# Patient Record
Sex: Male | Born: 2000 | Race: Black or African American | Hispanic: No | Marital: Single
Health system: Southern US, Community
[De-identification: ages and names within clinical notes are randomized; demographics above are authoritative.]

---
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
## 2020-02-12 ENCOUNTER — Emergency Department (HOSPITAL_COMMUNITY)
Admission: EM | Admit: 2020-02-12 | Discharge: 2020-02-12 | Disposition: A | Discharge status: Home / Self Care | Payer: 59 | Attending: Emergency Medicine | Admitting: Emergency Medicine

## 2020-02-12 ENCOUNTER — Encounter (HOSPITAL_COMMUNITY): Payer: Self-pay | Admitting: Emergency Medicine

## 2020-02-12 ENCOUNTER — Emergency Department (HOSPITAL_COMMUNITY): Payer: 59

## 2020-02-12 DIAGNOSIS — N50812 Left testicular pain: Secondary | ICD-10-CM | POA: Diagnosis present

## 2020-02-12 NOTE — ED Provider Notes (Signed)
[facility] EMERGENCY DEPARTMENT Provider Note   CSN: [id number] Arrival date & time: 02/12/20  0809     History Chief Complaint  Patient presents with  . Testicle Pain    Gerald Barr is a 19 y.o. male.  Patient presents with persistent left testicle pain for 1 month. No injuries, no concerns for STDs, no history of STDs, no dysuria or discharge. No weight loss fevers or chills.        History reviewed. No pertinent past medical history.  There are no problems to display for this patient.   History reviewed. No pertinent surgical history.     No family history on file.  Social History   Tobacco Use  . Smoking status: Not on file  Substance Use Topics  . Alcohol use: Not on file  . Drug use: Not on file    Home Medications Prior to Admission medications   Medication Sig Start Date End Date Taking? Authorizing Provider  cyclobenzaprine (FLEXERIL) 10 MG tablet Take 1 tablet (10 mg total) by mouth 2 (two) times daily as needed for muscle spasms. 04/16/19   Ree Shay, MD    Allergies    Patient has no known allergies.  Review of Systems   Review of Systems  Constitutional: Negative for chills and fever.  HENT: Negative for congestion.   Respiratory: Negative for shortness of breath.   Cardiovascular: Negative for chest pain.  Gastrointestinal: Negative for abdominal pain and vomiting.  Genitourinary: Negative for dysuria and flank pain.  Musculoskeletal: Negative for back pain, neck pain and neck stiffness.  Skin: Negative for rash.  Neurological: Negative for light-headedness and headaches.    Physical Exam Updated Vital Signs BP (!) 135/59 (BP Location: Right Arm)   Pulse 64   Temp 99.3 F (37.4 C) (Oral)   Resp 17   Ht 5\' 7"  (1.702 m)   Wt 61.2 kg   SpO2 100%   BMI 21.14 kg/m   Physical Exam Vitals and nursing note reviewed.  Constitutional:      Appearance: He is well-developed.  HENT:     Head: Normocephalic.    Eyes:     General:        Right eye: No discharge.        Left eye: No discharge.     Conjunctiva/sclera: Conjunctivae normal.  Neck:     Trachea: No tracheal deviation.  Cardiovascular:     Rate and Rhythm: Normal rate.  Pulmonary:     Effort: Pulmonary effort is normal.  Abdominal:     General: There is no distension.  Genitourinary:    Penis: Normal.      Comments:  testicles normal position, mild tenderness to palpation of superior aspect of left testicle, no edema or external sign of infection Musculoskeletal:     Cervical back: Normal range of motion.  Skin:    General: Skin is warm.     Findings: No rash.  Neurological:     Mental Status: He is alert and oriented to person, place, and time.     ED Results / Procedures / Treatments   Labs (all labs ordered are listed, but only abnormal results are displayed) Labs Reviewed - No data to display  EKG None  Radiology SCROTUM W/DOPPLER  Result Date: 02/12/2020 CLINICAL DATA:  Left testicular pain for 1 month. EXAM: SCROTAL ULTRASOUND DOPPLER ULTRASOUND OF THE TESTICLES TECHNIQUE: Complete ultrasound examination of the testicles, epididymis, and other scrotal structures was performed. Color  and spectral Doppler ultrasound were also utilized to evaluate blood flow to the testicles. COMPARISON:  None. FINDINGS: Right testicle Measurements: 4.4 x 2.1 x 3.1 cm. Scattered punctate calcifications. No mass. Left testicle Measurements: 4.5 x 1.9 x 2.6 cm. Scattered punctate calcifications. No mass. Right epididymis:  Normal in size and appearance. Left epididymis:  Normal in size and appearance. Hydrocele:  None visualized. Varicocele:  None visualized. Pulsed Doppler interrogation of both testes demonstrates normal low resistance arterial and venous waveforms bilaterally. Blood flow in both testicles is subjectively mildly increased on color Doppler imaging but is symmetric between right and left sides without focality and may be  within the range of normal. IMPRESSION: 1. No definite acute or focal finding. 2. Bilateral testicular microlithiasis. Current literature suggests that testicular microlithiasis is not a significant independent risk factor for development of testicular carcinoma, and that follow up imaging is not warranted in the absence of other risk factors. Monthly testicular self-examination and annual physical exams are considered appropriate surveillance. If patient has other risk factors for testicular carcinoma, then referral to Urology should be considered. (Reference: DeCastro, et al.: A 5-Year Follow up Study of Asymptomatic Men with Testicular Microlithiasis. J Urol 2008; 179:1420-1423.) Electronically Signed   By: Sebastian Ache M.D.   On: 02/12/2020 09:42    Procedures Procedures (including critical care time)  Medications Ordered in ED Medications - No data to display  ED Course  I have reviewed the triage vital signs and the nursing notes.  Pertinent labs & imaging results that were available during my care of the patient were reviewed by me and considered in my medical decision making (see chart for details).    MDM Rules/Calculators/A&P                          Patient presents with testicular pain persistent for 1 week, ultrasound reviewed and showed normal blood flow, microlithiasis. Patient stable for follow-up with urology. No concern for infection at this time.  Final Clinical Impression(s) / ED Diagnoses Final diagnoses:  Pain in left testicle    Rx / DC Orders ED Discharge Orders    None       Blane Ohara, MD 02/12/20 1239

## 2020-02-12 NOTE — ED Triage Notes (Signed)
Pt reports testicular pain without swelling x1 month, denies urinary symptoms, discharge or concern for STDs.

## 2020-02-12 NOTE — Discharge Instructions (Addendum)
Follow-up with local urologist Tylenol and motrin for pain.

## 2021-11-22 IMAGING — US US SCROTUM W/ DOPPLER COMPLETE
2 series · 13 of 25 positions shown · non-contrast
Comparison: None.

CLINICAL DATA: Left testicular pain for 1 month.

EXAM:
SCROTAL ULTRASOUND
DOPPLER ULTRASOUND OF THE TESTICLES
TECHNIQUE: Complete ultrasound examination of the testicles, epididymis, and
other scrotal structures was performed. Color and spectral Doppler
ultrasound were also utilized to evaluate blood flow to the
testicles.

[Series 1: us scrotum w/doppler · 12 of 50 slices shown]
[im 1/50]
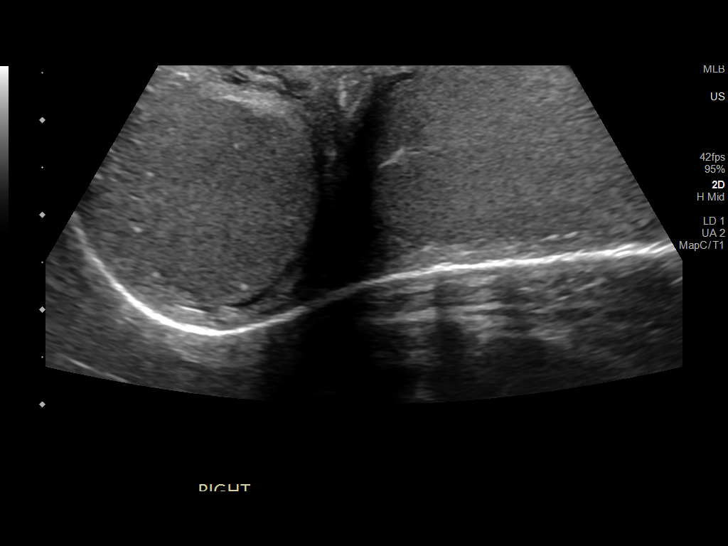
[im 5/50]
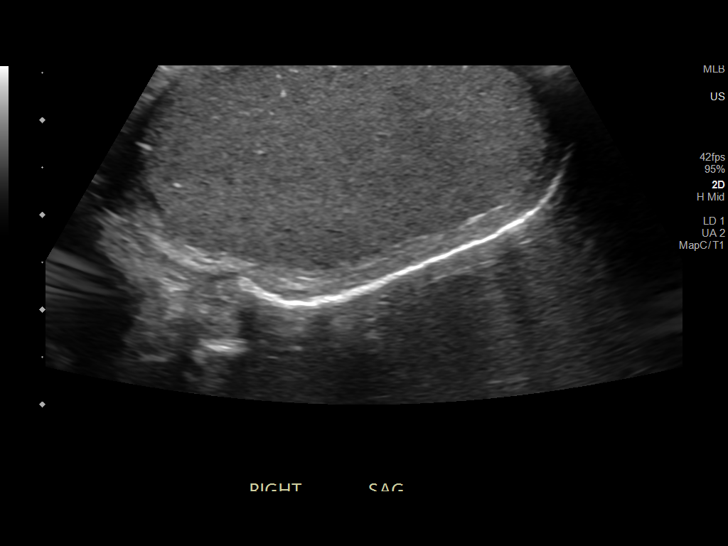
[im 9/50]
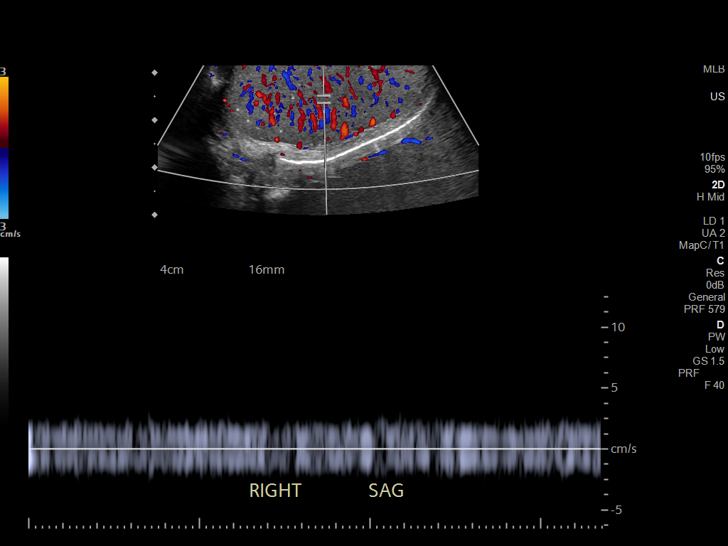
[im 13/50]
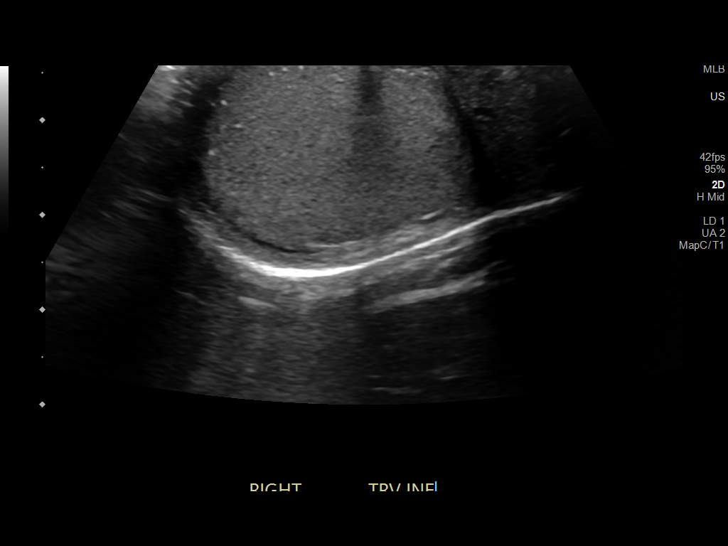
[im 18/50]
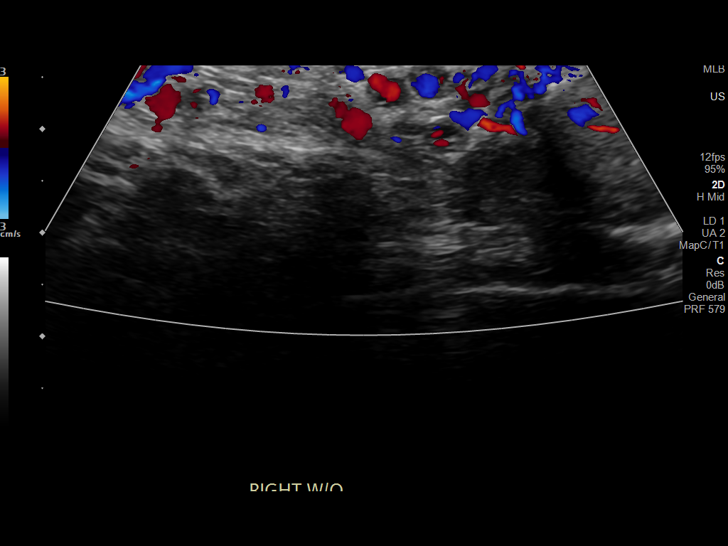
[im 22/50]
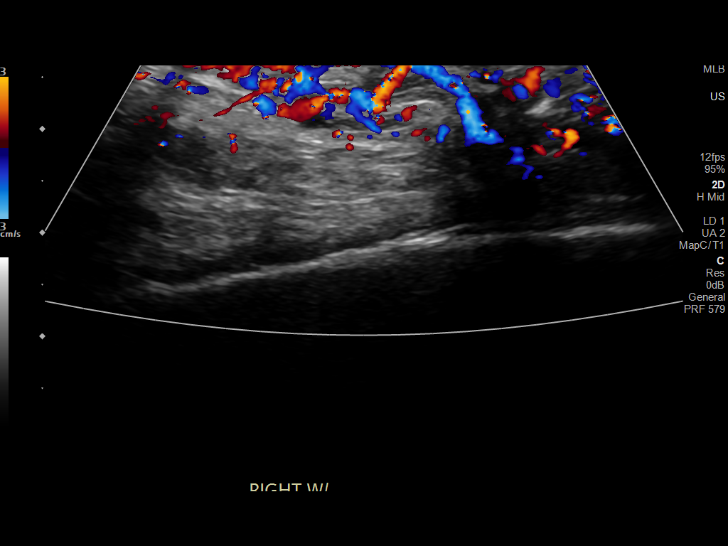
[im 26/50]
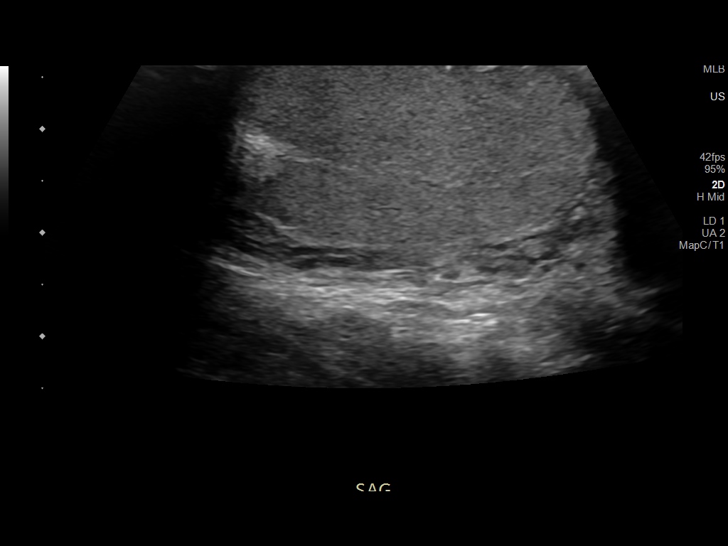
[im 30/50]
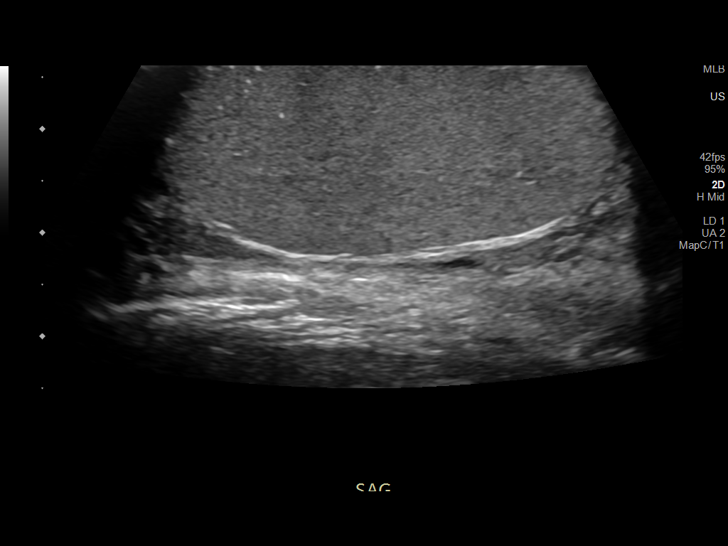
[im 35/50]
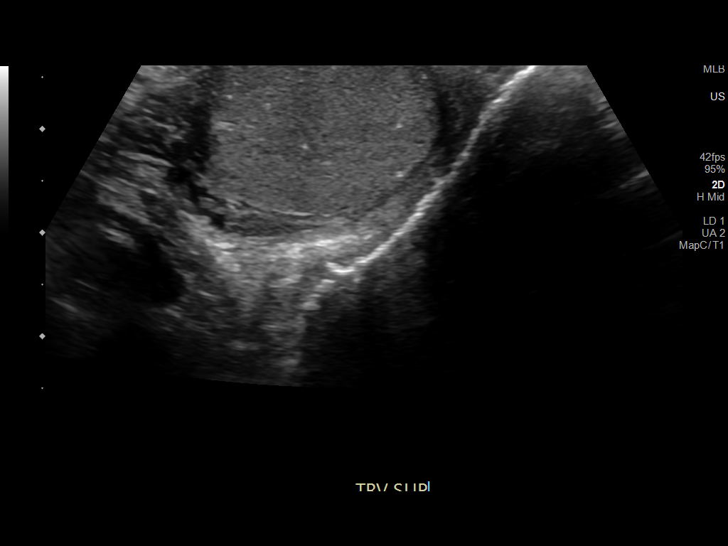
[im 39/50]
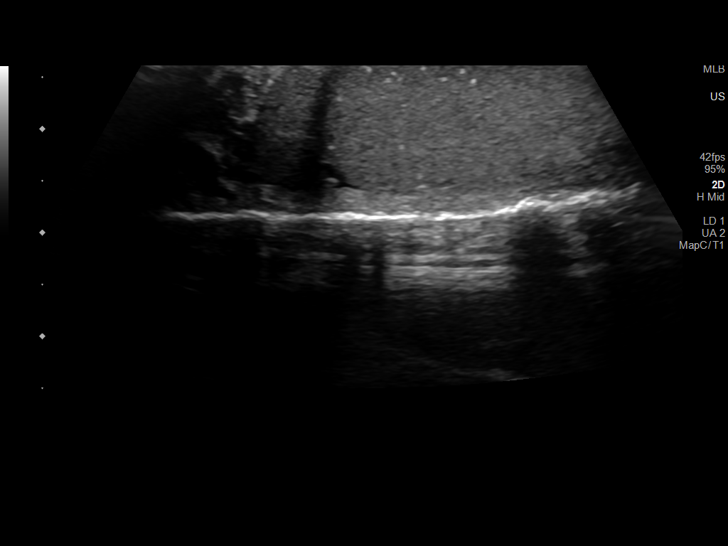
[im 43/50]
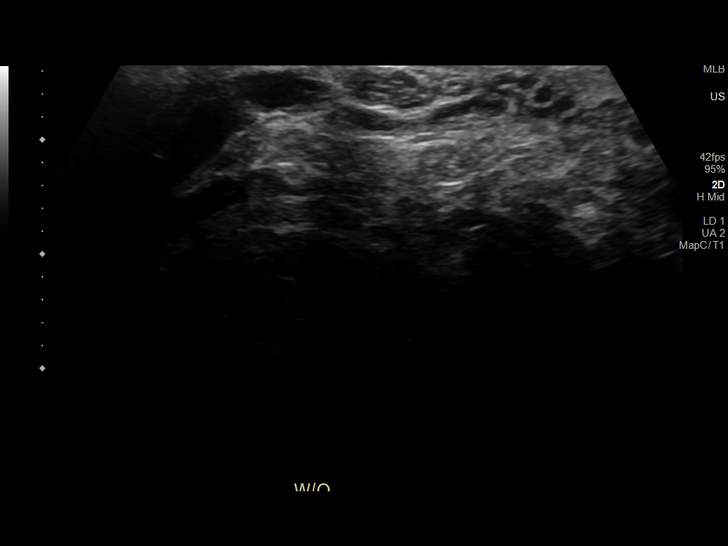
[im 47/50]
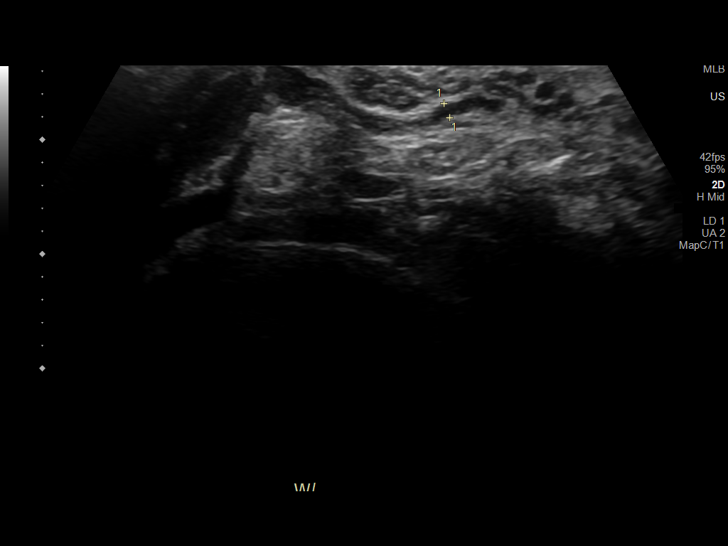

[Series 1001: testis us · 1 of 1 slices shown]
[im 1/1]
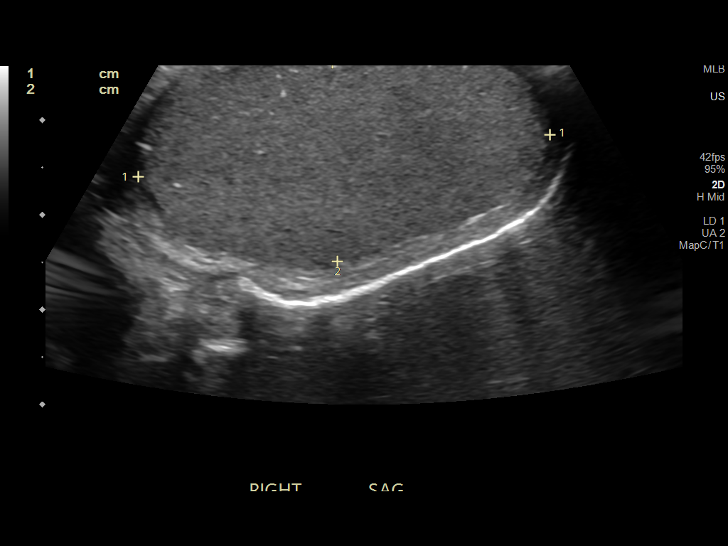

[13 of 25 positions shown; findings below may reference images not displayed]

FINDINGS: Right testicle

Measurements: 4.4 x 2.1 x 3.1 cm. Scattered punctate calcifications.
No mass.

Left testicle

Measurements: 4.5 x 1.9 x 2.6 cm. Scattered punctate
calcifications. No mass.

Right epididymis:  Normal in size and appearance.

Left epididymis:  Normal in size and appearance.

Hydrocele:  None visualized.

Varicocele:  None visualized.

Pulsed Doppler interrogation of both testes demonstrates normal low
resistance arterial and venous waveforms bilaterally. Blood flow in
both testicles is subjectively mildly increased on color Doppler
imaging but is symmetric between right and left sides without
focality and may be within the range of normal.
IMPRESSION: 1. No definite acute or focal finding.
2. Bilateral testicular microlithiasis. Current literature suggests
that testicular microlithiasis is not a significant independent risk
factor for development of testicular carcinoma, and that follow up
imaging is not warranted in the absence of other risk factors.
Monthly testicular self-examination and annual physical exams are
considered appropriate surveillance. If patient has other risk
factors for testicular carcinoma, then referral to Urology should be
considered. (Reference: Gebhardt, et al.: A 5-Year Follow up Study
of Asymptomatic Men with Testicular Microlithiasis. J Urol 5337;
